# Patient Record
Sex: Female | Born: 1995 | Race: White | Hispanic: No | Smoking: Current every day smoker
Health system: Southern US, Community
[De-identification: ages and names within clinical notes are randomized; demographics above are authoritative.]

## PROBLEM LIST (undated history)

## (undated) DIAGNOSIS — O24419 Gestational diabetes mellitus in pregnancy, unspecified control: Secondary | ICD-10-CM

---

## 2018-01-03 ENCOUNTER — Other Ambulatory Visit: Payer: Self-pay

## 2018-01-03 ENCOUNTER — Emergency Department
Admission: EM | Admit: 2018-01-03 | Discharge: 2018-01-03 | Disposition: A | Discharge status: Home / Self Care | Payer: Self-pay | Attending: Emergency Medicine | Admitting: Emergency Medicine

## 2018-01-03 ENCOUNTER — Emergency Department: Payer: Self-pay

## 2018-01-03 DIAGNOSIS — R1084 Generalized abdominal pain: Secondary | ICD-10-CM | POA: Insufficient documentation

## 2018-01-03 DIAGNOSIS — N10 Acute tubulo-interstitial nephritis: Secondary | ICD-10-CM | POA: Insufficient documentation

## 2018-01-03 DIAGNOSIS — F17210 Nicotine dependence, cigarettes, uncomplicated: Secondary | ICD-10-CM | POA: Insufficient documentation

## 2018-01-03 DIAGNOSIS — N12 Tubulo-interstitial nephritis, not specified as acute or chronic: Secondary | ICD-10-CM

## 2018-01-03 LAB — COMPREHENSIVE METABOLIC PANEL
ALK PHOS: 87 U/L (ref 38–126)
ALT: 12 U/L (ref 0–44)
ANION GAP: 8 (ref 5–15)
AST: 16 U/L (ref 15–41)
Albumin: 4.2 g/dL (ref 3.5–5.0)
BUN: 9 mg/dL (ref 6–20)
CALCIUM: 9 mg/dL (ref 8.9–10.3)
CO2: 29 mmol/L (ref 22–32)
Chloride: 102 mmol/L (ref 98–111)
Creatinine, Ser: 0.48 mg/dL (ref 0.44–1.00)
Glucose, Bld: 89 mg/dL (ref 70–99)
Potassium: 3.7 mmol/L (ref 3.5–5.1)
SODIUM: 139 mmol/L (ref 135–145)
TOTAL PROTEIN: 7.4 g/dL (ref 6.5–8.1)
Total Bilirubin: 0.5 mg/dL (ref 0.3–1.2)

## 2018-01-03 LAB — URINALYSIS, ROUTINE W REFLEX MICROSCOPIC
BILIRUBIN URINE: NEGATIVE
Bacteria, UA: NONE SEEN
GLUCOSE, UA: NEGATIVE mg/dL
KETONES UR: NEGATIVE mg/dL
NITRITE: NEGATIVE
PH: 6 (ref 5.0–8.0)
Protein, ur: 30 mg/dL — AB
Specific Gravity, Urine: 1.003 — ABNORMAL LOW (ref 1.005–1.030)
WBC, UA: >50 WBC/hpf — ABNORMAL HIGH (ref 0–5)

## 2018-01-03 LAB — CBC WITH DIFFERENTIAL/PLATELET
Basophils Absolute: 0 10*3/uL (ref 0–0.1)
Basophils Relative: 0 %
EOS ABS: 0.1 10*3/uL (ref 0–0.7)
EOS PCT: 1 %
HCT: 33.5 % — ABNORMAL LOW (ref 35.0–47.0)
Hemoglobin: 11.5 g/dL — ABNORMAL LOW (ref 12.0–16.0)
LYMPHS ABS: 1.3 10*3/uL (ref 1.0–3.6)
LYMPHS PCT: 13 %
MCH: 27.4 pg (ref 26.0–34.0)
MCHC: 34.2 g/dL (ref 32.0–36.0)
MCV: 80.3 fL (ref 80.0–100.0)
MONOS PCT: 9 %
Monocytes Absolute: 0.8 10*3/uL (ref 0.2–0.9)
Neutro Abs: 7.4 10*3/uL — ABNORMAL HIGH (ref 1.4–6.5)
Neutrophils Relative %: 77 %
Platelets: 217 10*3/uL (ref 150–440)
RBC: 4.18 MIL/uL (ref 3.80–5.20)
RDW: 14.6 % — ABNORMAL HIGH (ref 11.5–14.5)
WBC: 9.7 10*3/uL (ref 3.6–11.0)

## 2018-01-03 LAB — POCT PREGNANCY, URINE: Preg Test, Ur: NEGATIVE

## 2018-01-03 MED ORDER — PHENAZOPYRIDINE HCL 200 MG PO TABS: 200.0000 mg | ORAL_TABLET | Freq: Three times a day (TID) | ORAL | 0 refills | Status: AC | PRN

## 2018-01-03 MED ORDER — SODIUM CHLORIDE 0.9 % IV SOLN
Freq: Once | INTRAVENOUS | Status: AC
  Administered 2018-01-03: 20:00:00 via INTRAVENOUS

## 2018-01-03 MED ORDER — SULFAMETHOXAZOLE-TRIMETHOPRIM 800-160 MG PO TABS: 1.0000 | ORAL_TABLET | Freq: Two times a day (BID) | ORAL | 0 refills | Status: DC

## 2018-01-03 MED ORDER — SODIUM CHLORIDE 0.9 % IV SOLN
1.0000 g | Freq: Once | INTRAVENOUS | Status: AC
  Administered 2018-01-03: 1 g via INTRAVENOUS

## 2018-01-03 MED ORDER — SODIUM CHLORIDE 0.9 % IV SOLN
INTRAVENOUS | Status: AC
  Administered 2018-01-03: 1 g via INTRAVENOUS
  Filled 2018-01-03: qty 10

## 2018-01-03 NOTE — ED Triage Notes (Signed)
Pt arrives to ED via POV from home with c/o low back pain x4 days. Pt denies any recent falls, injury, or trauma. Pt reports h/x of kidney infections in the past and states her current symptoms seem similar. No N/V/D, no fever, no CP or SHOB. Pt reports burning with urination, "like a UTI".

## 2018-01-03 NOTE — ED Provider Notes (Signed)
[facility] Emergency Department Provider Note       Time seen: ----------------------------------------- 8:01 PM on 01/03/2018 -----------------------------------------   I have reviewed the triage vital signs and the nursing notes.  HISTORY   Chief Complaint Back Pain    HPI Kimberly Tucker is a 22 y.o. female with no significant past medical history who presents to the ED for low back pain for the past 4 days.  Patient states she has had intermittent urinary tract infection symptoms for the past 2 weeks but it seemed to resolve spontaneously.  Subsequently she began having back pain and she has had one episode of pyelonephritis in the past.  She denies fevers or chills.  History reviewed. No pertinent past medical history.  There are no active problems to display for this patient.   History reviewed. No pertinent surgical history.  Allergies Patient has no known allergies.  Social History Social History   Tobacco Use  . Smoking status: Current Every Day Smoker    Types: E-cigarettes  . Smokeless tobacco: Never Used  Substance Use Topics  . Alcohol use: Not on file  . Drug use: Not on file    Review of Systems Constitutional: Negative for fever. Cardiovascular: Negative for chest pain. Respiratory: Negative for shortness of breath. Gastrointestinal: Negative for abdominal pain, vomiting and diarrhea. Genitourinary: Positive for dysuria Musculoskeletal: Positive for back pain Skin: Negative for rash. Neurological: Negative for headaches, focal weakness or numbness.  All systems negative/normal/unremarkable except as stated in the HPI  ____________________________________________   PHYSICAL EXAM:  VITAL SIGNS: ED Triage Vitals  Enc Vitals Group     BP 01/03/18 1919 131/75     Pulse Rate 01/03/18 1919 (!) 118     Resp 01/03/18 1919 18     Temp 01/03/18 1919 99.1 F (37.3 C)     Temp Source 01/03/18 1919 Oral     SpO2  01/03/18 1919 100 %     Weight 01/03/18 1919 153 lb (69.4 kg)     Height 01/03/18 1919 5\' 5"  (1.651 m)     Head Circumference --      Peak Flow --      Pain Score 01/03/18 1925 7     Pain Loc --      Pain Edu? --      Excl. in [place]? --    Constitutional: Alert and oriented. Well appearing and in no distress. Eyes: Conjunctivae are normal. Normal extraocular movements. ENT   Head: Normocephalic and atraumatic.   Nose: No congestion/rhinnorhea.   Mouth/Throat: Mucous membranes are moist.   Neck: No stridor. Cardiovascular: Normal rate, regular rhythm. No murmurs, rubs, or gallops. Respiratory: Normal respiratory effort without tachypnea nor retractions. Breath sounds are clear and equal bilaterally. No wheezes/rales/rhonchi. Gastrointestinal: Soft and nontender. Normal bowel sounds.  Bilateral CVA tenderness is noted Musculoskeletal: Nontender with normal range of motion in extremities. No lower extremity tenderness nor edema. Neurologic:  Normal speech and language. No gross focal neurologic deficits are appreciated.  Skin:  Skin is warm, dry and intact. No rash noted. Psychiatric: Mood and affect are normal. Speech and behavior are normal.  ___________________________________________  ED COURSE:  As part of my medical decision making, I reviewed the following data within the [place] History obtained from family if available, nursing notes, old chart and ekg, as well as notes from prior ED visits. Patient presented for back pain with recent urinary symptoms and likely pyelonephritis, we will assess with labs as  indicated at this time.   Procedures ____________________________________________   LABS (pertinent positives/negatives)  Labs Reviewed  URINALYSIS, ROUTINE W REFLEX MICROSCOPIC - Abnormal; Notable for the following components:      Result Value   Color, Urine STRAW (*)    APPearance HAZY (*)    Specific Gravity, Urine 1.003 (*)    Hgb  urine dipstick MODERATE (*)    Protein, ur 30 (*)    Leukocytes, UA LARGE (*)    WBC, UA >50 (*)    All other components within normal limits  CBC WITH DIFFERENTIAL/PLATELET - Abnormal; Notable for the following components:   Hemoglobin 11.5 (*)    HCT 33.5 (*)    RDW 14.6 (*)    Neutro Abs 7.4 (*)    All other components within normal limits  URINE CULTURE  COMPREHENSIVE METABOLIC PANEL  POC URINE PREG, ED  POCT PREGNANCY, URINE   CT Renal protocol IMPRESSION: 1. Negative for hydronephrosis or ureteral stone 2. Small calcified uterine fibroid ___________________________________________  DIFFERENTIAL DIAGNOSIS   UTI, pyelonephritis, renal colic, musculoskeletal pain    FINAL ASSESSMENT AND PLAN  Pyelonephritis   Plan: The patient had presented for low back pain with recent urinary symptoms. Patient's labs did reveal UTI and clinically she has pyelonephritis.  She was given fluids and IV Rocephin.  We did send a urine culture, she is cleared for outpatient follow-up.   Laurence Aly, MD   Note: This note was generated in part or whole with voice recognition software. Voice recognition is usually quite accurate but there are transcription errors that can and very often do occur. I apologize for any typographical errors that were not detected and corrected.     Earleen Newport, MD 01/03/18 2137

## 2018-01-06 LAB — URINE CULTURE
Culture: >=100000 — AB
Special Requests: NORMAL

## 2019-03-09 LAB — OB RESULTS CONSOLE GC/CHLAMYDIA
Chlamydia: NEGATIVE
Gonorrhea: NEGATIVE

## 2019-03-09 LAB — OB RESULTS CONSOLE RPR: RPR: NONREACTIVE

## 2019-03-09 LAB — OB RESULTS CONSOLE HIV ANTIBODY (ROUTINE TESTING): HIV: NONREACTIVE

## 2019-03-09 LAB — OB RESULTS CONSOLE ABO/RH: RH Type: POSITIVE

## 2019-03-09 LAB — OB RESULTS CONSOLE HEPATITIS B SURFACE ANTIGEN: Hepatitis B Surface Ag: NEGATIVE

## 2019-03-09 LAB — OB RESULTS CONSOLE RUBELLA ANTIBODY, IGM: Rubella: IMMUNE

## 2019-10-10 ENCOUNTER — Encounter (HOSPITAL_COMMUNITY): Payer: Self-pay | Admitting: *Deleted

## 2019-10-10 ENCOUNTER — Telehealth (HOSPITAL_COMMUNITY): Payer: Self-pay | Admitting: *Deleted

## 2019-10-10 NOTE — Telephone Encounter (Signed)
Preadmission screen  

## 2019-10-10 NOTE — Patient Instructions (Signed)
Kimberly Tucker  10/10/2019   Your procedure is scheduled on:  10/23/2019  Arrive at 1000 at Entrance C on [address] at [facility]  and [employer] [employer] [address]. You are invited to use the FREE valet parking or use the Visitor's parking deck.  Pick up the phone at the desk and dial [phone].  Call this number if you have problems the morning of surgery: [phone]  Remember:   Do not eat food:(After Midnight) Desps de medianoche.  Do not drink clear liquids: (After Midnight) Desps de medianoche.  Take these medicines the morning of surgery with A SIP OF WATER:  none   Do not wear jewelry, make-up or nail polish.  Do not wear lotions, powders, or perfumes. Do not wear deodorant.  Do not shave 48 hours prior to surgery.  Do not bring valuables to the hospital.  [facility] is not   responsible for any belongings or valuables brought to the hospital.  Contacts, dentures or bridgework may not be worn into surgery.  Leave suitcase in the car. After surgery it may be brought to your room.  For patients admitted to the hospital, checkout time is 11:00 AM the day of              discharge.      Please read over the following fact sheets that you were given:     Preparing for Surgery

## 2019-10-11 ENCOUNTER — Telehealth (HOSPITAL_COMMUNITY): Payer: Self-pay | Admitting: *Deleted

## 2019-10-11 NOTE — Telephone Encounter (Signed)
Preadmission screen  

## 2019-10-12 ENCOUNTER — Telehealth (HOSPITAL_COMMUNITY): Payer: Self-pay | Admitting: *Deleted

## 2019-10-12 NOTE — Telephone Encounter (Signed)
Preadmission screen  

## 2019-10-16 ENCOUNTER — Encounter (HOSPITAL_COMMUNITY): Payer: Self-pay

## 2019-10-21 ENCOUNTER — Other Ambulatory Visit: Payer: Self-pay

## 2019-10-21 ENCOUNTER — Other Ambulatory Visit (HOSPITAL_COMMUNITY)
Admission: RE | Admit: 2019-10-21 | Discharge: 2019-10-21 | Disposition: A | Discharge status: Home / Self Care | Payer: 59 | Source: Ambulatory Visit | Attending: Obstetrics | Admitting: Obstetrics

## 2019-10-21 DIAGNOSIS — Z01812 Encounter for preprocedural laboratory examination: Secondary | ICD-10-CM | POA: Insufficient documentation

## 2019-10-21 DIAGNOSIS — Z20822 Contact with and (suspected) exposure to covid-19: Secondary | ICD-10-CM | POA: Insufficient documentation

## 2019-10-21 HISTORY — DX: Gestational diabetes mellitus in pregnancy, unspecified control: O24.419

## 2019-10-21 LAB — CBC
HCT: 31 % — ABNORMAL LOW (ref 36.0–46.0)
Hemoglobin: 9.6 g/dL — ABNORMAL LOW (ref 12.0–15.0)
MCH: 24.2 pg — ABNORMAL LOW (ref 26.0–34.0)
MCHC: 31 g/dL (ref 30.0–36.0)
MCV: 78.3 fL — ABNORMAL LOW (ref 80.0–100.0)
Platelets: 159 10*3/uL (ref 150–400)
RBC: 3.96 MIL/uL (ref 3.87–5.11)
RDW: 16.2 % — ABNORMAL HIGH (ref 11.5–15.5)
WBC: 8.8 10*3/uL (ref 4.0–10.5)
nRBC: 0 % (ref 0.0–0.2)

## 2019-10-21 LAB — SARS CORONAVIRUS 2 (TAT 6-24 HRS): SARS Coronavirus 2: NEGATIVE

## 2019-10-21 LAB — ABO/RH: ABO/RH(D): A POS

## 2019-10-21 LAB — RPR: RPR Ser Ql: NONREACTIVE

## 2019-10-21 NOTE — MAU Note (Signed)
Asymptomatic, swab collected. Lab here for draw. 

## 2019-10-23 ENCOUNTER — Encounter (HOSPITAL_COMMUNITY)
Admission: RE | Disposition: A | Discharge status: Home / Self Care | Payer: Self-pay | Source: Ambulatory Visit | Attending: Obstetrics

## 2019-10-23 ENCOUNTER — Encounter (HOSPITAL_COMMUNITY): Payer: Self-pay | Admitting: Obstetrics

## 2019-10-23 ENCOUNTER — Inpatient Hospital Stay (HOSPITAL_COMMUNITY): Payer: 59 | Admitting: Anesthesiology

## 2019-10-23 ENCOUNTER — Other Ambulatory Visit: Payer: Self-pay

## 2019-10-23 ENCOUNTER — Inpatient Hospital Stay (HOSPITAL_COMMUNITY)
Admission: RE | Admit: 2019-10-23 | Discharge: 2019-10-25 | DRG: 788 | Disposition: A | Discharge status: Home / Self Care | Payer: 59 | Attending: Obstetrics | Admitting: Obstetrics

## 2019-10-23 DIAGNOSIS — D252 Subserosal leiomyoma of uterus: Secondary | ICD-10-CM | POA: Diagnosis present

## 2019-10-23 DIAGNOSIS — O99334 Smoking (tobacco) complicating childbirth: Secondary | ICD-10-CM | POA: Diagnosis present

## 2019-10-23 DIAGNOSIS — O3413 Maternal care for benign tumor of corpus uteri, third trimester: Secondary | ICD-10-CM | POA: Diagnosis present

## 2019-10-23 DIAGNOSIS — O24420 Gestational diabetes mellitus in childbirth, diet controlled: Secondary | ICD-10-CM | POA: Diagnosis present

## 2019-10-23 DIAGNOSIS — O34211 Maternal care for low transverse scar from previous cesarean delivery: Secondary | ICD-10-CM | POA: Diagnosis present

## 2019-10-23 DIAGNOSIS — Z20822 Contact with and (suspected) exposure to covid-19: Secondary | ICD-10-CM | POA: Diagnosis present

## 2019-10-23 DIAGNOSIS — F17290 Nicotine dependence, other tobacco product, uncomplicated: Secondary | ICD-10-CM | POA: Diagnosis present

## 2019-10-23 DIAGNOSIS — O34219 Maternal care for unspecified type scar from previous cesarean delivery: Secondary | ICD-10-CM | POA: Diagnosis present

## 2019-10-23 DIAGNOSIS — Z3A39 39 weeks gestation of pregnancy: Secondary | ICD-10-CM | POA: Diagnosis not present

## 2019-10-23 DIAGNOSIS — O9902 Anemia complicating childbirth: Secondary | ICD-10-CM | POA: Diagnosis present

## 2019-10-23 DIAGNOSIS — D509 Iron deficiency anemia, unspecified: Secondary | ICD-10-CM | POA: Diagnosis present

## 2019-10-23 LAB — TYPE AND SCREEN
ABO/RH(D): A POS
ABO/RH(D): A POS
Antibody Screen: NEGATIVE
Antibody Screen: NEGATIVE

## 2019-10-23 LAB — GLUCOSE, CAPILLARY
Glucose-Capillary: 68 mg/dL — ABNORMAL LOW (ref 70–99)
Glucose-Capillary: 90 mg/dL (ref 70–99)
Glucose-Capillary: 71 mg/dL (ref 70–99)

## 2019-10-23 SURGERY — Surgical Case
Anesthesia: Spinal

## 2019-10-23 MED ORDER — NALOXONE HCL 0.4 MG/ML IJ SOLN: 0.4000 mg | INTRAMUSCULAR | Status: DC | PRN

## 2019-10-23 MED ORDER — LACTATED RINGERS IV SOLN: INTRAVENOUS | Status: DC

## 2019-10-23 MED ORDER — ONDANSETRON HCL 4 MG/2ML IJ SOLN: 4.0000 mg | Freq: Once | INTRAMUSCULAR | Status: DC | PRN

## 2019-10-23 MED ORDER — SENNOSIDES-DOCUSATE SODIUM 8.6-50 MG PO TABS
2.0000 | ORAL_TABLET | ORAL | Status: DC
  Administered 2019-10-24 (×2): 2 via ORAL
  Filled 2019-10-23 (×2): qty 2

## 2019-10-23 MED ORDER — ONDANSETRON HCL 4 MG/2ML IJ SOLN
INTRAMUSCULAR | Status: AC
  Filled 2019-10-23: qty 2

## 2019-10-23 MED ORDER — SIMETHICONE 80 MG PO CHEW
80.0000 mg | CHEWABLE_TABLET | Freq: Three times a day (TID) | ORAL | Status: DC
  Administered 2019-10-23 – 2019-10-24 (×4): 80 mg via ORAL
  Filled 2019-10-23 (×5): qty 1

## 2019-10-23 MED ORDER — NALBUPHINE HCL 10 MG/ML IJ SOLN: 5.0000 mg | Freq: Once | INTRAMUSCULAR | Status: DC | PRN

## 2019-10-23 MED ORDER — SCOPOLAMINE 1 MG/3DAYS TD PT72
1.0000 | MEDICATED_PATCH | TRANSDERMAL | Status: DC
  Administered 2019-10-23: 1.5 mg via TRANSDERMAL

## 2019-10-23 MED ORDER — ONDANSETRON HCL 4 MG/2ML IJ SOLN: 4.0000 mg | Freq: Three times a day (TID) | INTRAMUSCULAR | Status: DC | PRN

## 2019-10-23 MED ORDER — OXYCODONE HCL 5 MG/5ML PO SOLN: 5.0000 mg | Freq: Once | ORAL | Status: DC | PRN

## 2019-10-23 MED ORDER — CEFAZOLIN SODIUM-DEXTROSE 2-4 GM/100ML-% IV SOLN
INTRAVENOUS | Status: AC
  Filled 2019-10-23: qty 100

## 2019-10-23 MED ORDER — DIPHENHYDRAMINE HCL 25 MG PO CAPS: 25.0000 mg | ORAL_CAPSULE | ORAL | Status: DC | PRN

## 2019-10-23 MED ORDER — SCOPOLAMINE 1 MG/3DAYS TD PT72: 1.0000 | MEDICATED_PATCH | Freq: Once | TRANSDERMAL | Status: DC

## 2019-10-23 MED ORDER — OXYTOCIN 40 UNITS IN NORMAL SALINE INFUSION - SIMPLE MED
INTRAVENOUS | Status: DC | PRN
  Administered 2019-10-23: 40 [IU] via INTRAVENOUS

## 2019-10-23 MED ORDER — FENTANYL CITRATE (PF) 100 MCG/2ML IJ SOLN: 25.0000 ug | INTRAMUSCULAR | Status: DC | PRN

## 2019-10-23 MED ORDER — MENTHOL 3 MG MT LOZG: 1.0000 | LOZENGE | OROMUCOSAL | Status: DC | PRN

## 2019-10-23 MED ORDER — OXYTOCIN 40 UNITS IN NORMAL SALINE INFUSION - SIMPLE MED: 2.5000 [IU]/h | INTRAVENOUS | Status: AC

## 2019-10-23 MED ORDER — OXYCODONE HCL 5 MG PO TABS: 5.0000 mg | ORAL_TABLET | Freq: Once | ORAL | Status: DC | PRN

## 2019-10-23 MED ORDER — SIMETHICONE 80 MG PO CHEW: 80.0000 mg | CHEWABLE_TABLET | ORAL | Status: DC | PRN

## 2019-10-23 MED ORDER — NALOXONE HCL 4 MG/10ML IJ SOLN
1.0000 ug/kg/h | INTRAVENOUS | Status: DC | PRN
  Filled 2019-10-23: qty 5

## 2019-10-23 MED ORDER — PHENYLEPHRINE HCL-NACL 20-0.9 MG/250ML-% IV SOLN
INTRAVENOUS | Status: AC
  Filled 2019-10-23: qty 250

## 2019-10-23 MED ORDER — FENTANYL CITRATE (PF) 100 MCG/2ML IJ SOLN
INTRAMUSCULAR | Status: AC
  Filled 2019-10-23: qty 2

## 2019-10-23 MED ORDER — LORATADINE 10 MG PO TABS
10.0000 mg | ORAL_TABLET | Freq: Every day | ORAL | Status: DC | PRN
  Administered 2019-10-23: 10 mg via ORAL
  Filled 2019-10-23: qty 1

## 2019-10-23 MED ORDER — DIPHENHYDRAMINE HCL 25 MG PO CAPS: 25.0000 mg | ORAL_CAPSULE | Freq: Four times a day (QID) | ORAL | Status: DC | PRN

## 2019-10-23 MED ORDER — KETOROLAC TROMETHAMINE 30 MG/ML IJ SOLN
30.0000 mg | Freq: Four times a day (QID) | INTRAMUSCULAR | Status: AC
  Administered 2019-10-23 – 2019-10-24 (×4): 30 mg via INTRAVENOUS
  Filled 2019-10-23 (×4): qty 1

## 2019-10-23 MED ORDER — MORPHINE SULFATE (PF) 0.5 MG/ML IJ SOLN
INTRAMUSCULAR | Status: DC | PRN
  Administered 2019-10-23: .15 mg via INTRATHECAL

## 2019-10-23 MED ORDER — OXYTOCIN 40 UNITS IN NORMAL SALINE INFUSION - SIMPLE MED
INTRAVENOUS | Status: AC
  Filled 2019-10-23: qty 1000

## 2019-10-23 MED ORDER — DIBUCAINE (PERIANAL) 1 % EX OINT: 1.0000 "application " | TOPICAL_OINTMENT | CUTANEOUS | Status: DC | PRN

## 2019-10-23 MED ORDER — LACTATED RINGERS IV SOLN: INTRAVENOUS | Status: DC | PRN

## 2019-10-23 MED ORDER — MEPERIDINE HCL 25 MG/ML IJ SOLN: 6.2500 mg | INTRAMUSCULAR | Status: DC | PRN

## 2019-10-23 MED ORDER — COCONUT OIL OIL: 1.0000 "application " | TOPICAL_OIL | Status: DC | PRN

## 2019-10-23 MED ORDER — NALBUPHINE HCL 10 MG/ML IJ SOLN: 5.0000 mg | INTRAMUSCULAR | Status: DC | PRN

## 2019-10-23 MED ORDER — PRENATAL MULTIVITAMIN CH
1.0000 | ORAL_TABLET | Freq: Every day | ORAL | Status: DC
  Administered 2019-10-24 – 2019-10-25 (×2): 1 via ORAL
  Filled 2019-10-23 (×2): qty 1

## 2019-10-23 MED ORDER — KETOROLAC TROMETHAMINE 30 MG/ML IJ SOLN
30.0000 mg | Freq: Once | INTRAMUSCULAR | Status: AC | PRN
  Administered 2019-10-23: 30 mg via INTRAVENOUS

## 2019-10-23 MED ORDER — BUPIVACAINE IN DEXTROSE 0.75-8.25 % IT SOLN
INTRATHECAL | Status: DC | PRN
  Administered 2019-10-23: 1.6 mL via INTRATHECAL

## 2019-10-23 MED ORDER — SCOPOLAMINE 1 MG/3DAYS TD PT72
MEDICATED_PATCH | TRANSDERMAL | Status: AC
  Filled 2019-10-23: qty 1

## 2019-10-23 MED ORDER — OXYCODONE HCL 5 MG PO TABS: 5.0000 mg | ORAL_TABLET | ORAL | Status: DC | PRN

## 2019-10-23 MED ORDER — PHENYLEPHRINE HCL-NACL 20-0.9 MG/250ML-% IV SOLN
INTRAVENOUS | Status: DC | PRN
  Administered 2019-10-23: 60 ug/min via INTRAVENOUS

## 2019-10-23 MED ORDER — FENTANYL CITRATE (PF) 100 MCG/2ML IJ SOLN
INTRAMUSCULAR | Status: DC | PRN
  Administered 2019-10-23: 15 ug via INTRATHECAL

## 2019-10-23 MED ORDER — ONDANSETRON HCL 4 MG/2ML IJ SOLN
INTRAMUSCULAR | Status: DC | PRN
  Administered 2019-10-23: 4 mg via INTRAVENOUS

## 2019-10-23 MED ORDER — ACETAMINOPHEN 500 MG PO TABS
1000.0000 mg | ORAL_TABLET | Freq: Four times a day (QID) | ORAL | Status: DC
  Administered 2019-10-23 – 2019-10-25 (×8): 1000 mg via ORAL
  Filled 2019-10-23 (×9): qty 2

## 2019-10-23 MED ORDER — IBUPROFEN 800 MG PO TABS
800.0000 mg | ORAL_TABLET | Freq: Four times a day (QID) | ORAL | Status: DC
  Administered 2019-10-24 – 2019-10-25 (×4): 800 mg via ORAL
  Filled 2019-10-23 (×4): qty 1

## 2019-10-23 MED ORDER — SIMETHICONE 80 MG PO CHEW
80.0000 mg | CHEWABLE_TABLET | ORAL | Status: DC
  Administered 2019-10-24 (×2): 80 mg via ORAL
  Filled 2019-10-23 (×2): qty 1

## 2019-10-23 MED ORDER — SODIUM CHLORIDE 0.9 % IR SOLN
Status: DC | PRN
  Administered 2019-10-23: 1

## 2019-10-23 MED ORDER — DIPHENHYDRAMINE HCL 50 MG/ML IJ SOLN: 12.5000 mg | INTRAMUSCULAR | Status: DC | PRN

## 2019-10-23 MED ORDER — KETOROLAC TROMETHAMINE 30 MG/ML IJ SOLN
INTRAMUSCULAR | Status: AC
  Filled 2019-10-23: qty 1

## 2019-10-23 MED ORDER — MORPHINE SULFATE (PF) 0.5 MG/ML IJ SOLN
INTRAMUSCULAR | Status: AC
  Filled 2019-10-23: qty 10

## 2019-10-23 MED ORDER — CEFAZOLIN SODIUM-DEXTROSE 2-3 GM-%(50ML) IV SOLR
INTRAVENOUS | Status: DC | PRN
  Administered 2019-10-23: 2 g via INTRAVENOUS

## 2019-10-23 MED ORDER — WITCH HAZEL-GLYCERIN EX PADS: 1.0000 "application " | MEDICATED_PAD | CUTANEOUS | Status: DC | PRN

## 2019-10-23 MED ORDER — SODIUM CHLORIDE 0.9 % IV SOLN: INTRAVENOUS | Status: DC | PRN

## 2019-10-23 MED ORDER — CEFAZOLIN SODIUM-DEXTROSE 2-4 GM/100ML-% IV SOLN: 2.0000 g | Freq: Once | INTRAVENOUS | Status: DC

## 2019-10-23 MED ORDER — SODIUM CHLORIDE 0.9% FLUSH
3.0000 mL | INTRAVENOUS | Status: DC | PRN
  Administered 2019-10-24: 3 mL via INTRAVENOUS

## 2019-10-23 MED ORDER — DEXTROSE 50 % IV SOLN
12.5000 g | INTRAVENOUS | Status: AC
  Administered 2019-10-23: 12.5 g via INTRAVENOUS

## 2019-10-23 MED ORDER — DEXTROSE 50 % IV SOLN
INTRAVENOUS | Status: AC
  Filled 2019-10-23: qty 50

## 2019-10-23 SURGICAL SUPPLY — 36 items
BENZOIN TINCTURE PRP APPL 2/3 (GAUZE/BANDAGES/DRESSINGS) ×3 IMPLANT
CHLORAPREP W/TINT 26ML (MISCELLANEOUS) ×3 IMPLANT
CLAMP CORD UMBIL (MISCELLANEOUS) IMPLANT
CLOSURE WOUND 1/2 X4 (GAUZE/BANDAGES/DRESSINGS) ×1
CLOTH BEACON ORANGE TIMEOUT ST (SAFETY) ×3 IMPLANT
DRSG OPSITE POSTOP 4X10 (GAUZE/BANDAGES/DRESSINGS) ×3 IMPLANT
ELECT REM PT RETURN 9FT ADLT (ELECTROSURGICAL) ×3
ELECTRODE REM PT RTRN 9FT ADLT (ELECTROSURGICAL) ×1 IMPLANT
EXTRACTOR VACUUM KIWI (MISCELLANEOUS) IMPLANT
GLOVE BIOGEL PI IND STRL 6.5 (GLOVE) ×1 IMPLANT
GLOVE BIOGEL PI IND STRL 7.0 (GLOVE) ×1 IMPLANT
GLOVE BIOGEL PI INDICATOR 6.5 (GLOVE) ×2
GLOVE BIOGEL PI INDICATOR 7.0 (GLOVE) ×2
GLOVE ECLIPSE 6.0 STRL STRAW (GLOVE) ×3 IMPLANT
GOWN STRL REUS W/TWL LRG LVL3 (GOWN DISPOSABLE) ×6 IMPLANT
KIT ABG SYR 3ML LUER SLIP (SYRINGE) IMPLANT
NEEDLE HYPO 25X5/8 SAFETYGLIDE (NEEDLE) IMPLANT
NS IRRIG 1000ML POUR BTL (IV SOLUTION) ×3 IMPLANT
PACK C SECTION WH (CUSTOM PROCEDURE TRAY) ×3 IMPLANT
PAD OB MATERNITY 4.3X12.25 (PERSONAL CARE ITEMS) ×3 IMPLANT
PENCIL SMOKE EVAC W/HOLSTER (ELECTROSURGICAL) ×3 IMPLANT
RTRCTR C-SECT PINK 25CM LRG (MISCELLANEOUS) ×3 IMPLANT
STRIP CLOSURE SKIN 1/2X4 (GAUZE/BANDAGES/DRESSINGS) ×2 IMPLANT
SUT MNCRL 0 VIOLET CTX 36 (SUTURE) ×2 IMPLANT
SUT MONOCRYL 0 CTX 36 (SUTURE) ×4
SUT PLAIN 0 NONE (SUTURE) IMPLANT
SUT PLAIN 2 0 (SUTURE) ×2
SUT PLAIN ABS 2-0 CT1 27XMFL (SUTURE) ×1 IMPLANT
SUT VIC AB 0 CTX 36 (SUTURE) ×4
SUT VIC AB 0 CTX36XBRD ANBCTRL (SUTURE) ×2 IMPLANT
SUT VIC AB 2-0 CT1 27 (SUTURE) ×2
SUT VIC AB 2-0 CT1 TAPERPNT 27 (SUTURE) ×1 IMPLANT
SUT VICRYL 4-0 PS2 18IN ABS (SUTURE) ×3 IMPLANT
TOWEL OR 17X24 6PK STRL BLUE (TOWEL DISPOSABLE) ×3 IMPLANT
TRAY FOLEY W/BAG SLVR 14FR LF (SET/KITS/TRAYS/PACK) ×3 IMPLANT
WATER STERILE IRR 1000ML POUR (IV SOLUTION) ×3 IMPLANT

## 2019-10-23 NOTE — Anesthesia Preprocedure Evaluation (Signed)
Anesthesia Evaluation  Patient identified by MRN, date of birth, ID band Patient awake    Reviewed: Allergy & Precautions, H&P , NPO status , Patient's Chart, lab work & pertinent test results  History of Anesthesia Complications Negative for: history of anesthetic complications  Airway Mallampati: II  TM Distance: >3 FB Neck ROM: full    Dental no notable dental hx.    Pulmonary neg pulmonary ROS, Current Smoker,    Pulmonary exam normal        Cardiovascular negative cardio ROS Normal cardiovascular exam     Neuro/Psych negative neurological ROS  negative psych ROS   GI/Hepatic negative GI ROS, Neg liver ROS,   Endo/Other  diabetes, Gestational  Renal/GU negative Renal ROS  negative genitourinary   Musculoskeletal   Abdominal   Peds  Hematology  (+) Blood dyscrasia, anemia ,   Anesthesia Other Findings   Reproductive/Obstetrics (+) Pregnancy                            Anesthesia Physical Anesthesia Plan  ASA: II  Anesthesia Plan: Spinal   Post-op Pain Management:    Induction:   PONV Risk Score and Plan: Ondansetron and Treatment may vary due to age or medical condition  Airway Management Planned:   Additional Equipment:   Intra-op Plan:   Post-operative Plan:   Informed Consent: I have reviewed the patients History and Physical, chart, labs and discussed the procedure including the risks, benefits and alternatives for the proposed anesthesia with the patient or authorized representative who has indicated his/her understanding and acceptance.       Plan Discussed with:   Anesthesia Plan Comments:         Anesthesia Quick Evaluation

## 2019-10-23 NOTE — Op Note (Signed)
Cesarean Section Procedure Note  Pre-operative Diagnosis: 1. Intrauterine pregnancy at [redacted]w[redacted]d  2. History of cesarean section, desires repeat  3.  Gestational diabetes mellitus, class A1  Post-operative Diagnosis: same as above  Surgeon: Jerelyn Charles, MD  Assistants: Vanessa Kick, MD  Procedure: Repeat low transverse cesarean section   Anesthesia: Spinal anesthesia  Estimated Blood Loss: 370 mL         Drains: Foley catheter         Specimens: placenta to L&D              Complications:  None; patient tolerated the procedure well.         Disposition: PACU - hemodynamically stable.  Findings:  Normal uterus, tubes and ovaries bilaterally.  Small 2 cm subserosal fibroid anterior mid uterus.  Viable female infant, 3495 g (7 lb 11.3 oz) Apgars 9, 9.    Procedure Details   After spinal  anesthesia was found to adequate, the patient was placed in the dorsal supine position with a leftward tilt, prepped and draped in the usual sterile manner. A Pfannenstiel incision was made through the prior scar and carried down through the subcutaneous tissue to the fascia.  The fascia was incised in the midline and the fascial incision was extended laterally with Mayo scissors. The superior aspect of the fascial incision was grasped with two Kocher clamps, tented up and the rectus muscles dissected off sharply. The rectus was then dissected off with blunt dissection and Mayo scissors inferiorly. The rectus muscles were separated in the midline. The abdominal peritoneum was identified, tented up, entered sharply, and the incision was extended superiorly and inferiorly with good visualization of the bladder. The Alexis retractor was deployed. The vesicouterine peritoneum was identified, tented up, entered sharply, and the bladder flap was created digitally. A scalpel was then used to make a low transverse incision on the uterus which was extended in the cephalad-caudad direction with blunt dissection. The fluid  was clear. The fetal vertex was identified, elevated out of the pelvis and brought to the hysterotomy.  The head was delivered easily followed by the shoulders and body.  After a 60 second delay per protocol, the cord was clamped and cut and the infant was passed to the waiting neonatologist.  The placenta was then delivered spontaneously, intact and appear normal, the uterus was cleared of all clot and debris   The hysterotomy was repaired with #0 Monocryl in running locked fashion.  Excellent hemostasis was noted.  The Alexis retractor was removed from the abdomen. The peritoneum was examined and all vessels noted to be hemostatic. The abdominal cavity was cleared of all clot and debris.  The peritoneum was closed with 2-0 vicryl in a running fashion.  The rectus muscles were then closed with 2-0 Vicryl. The fascia and rectus muscles were inspected and were hemostatic. The fascia was closed with 0 Vicryl in a running fashion. The subcutaneous layer was irrigated and all bleeders cauterized. The subcutaneous layer was closed with interrupted plain gut. The skin was closed with 3-0 monocryl in a subcuticular fashion. The incision was dressed with benzoine, steri strips and honeycomb dressing. All sponge lap and needle counts were correct x3. Patient tolerated the procedure well and recovered in stable condition following the procedure.

## 2019-10-23 NOTE — H&P (Signed)
24 y.o. G2P1001 @ [redacted]w[redacted]d presents for repeat cesarean section.    Otherwise has good fetal movement and no bleeding.     Pregnancy c/b: 1. Hstory of cesarean section for breech presentation.  Desires a repeat.   2. GDMA1 3.  Iron deficiency anemia.  Hgb 9.6 on admission  Past Medical History:  Diagnosis Date  . Gestational diabetes     Past Surgical History:  Procedure Laterality Date  . CESAREAN SECTION      OB History  Gravida Para Term Preterm AB Living  2 1 1     1   SAB TAB Ectopic Multiple Live Births          1    # Outcome Date GA Lbr Len/2nd Weight Sex Delivery Anes PTL Lv  2 Current           1 Term 07/25/16 [redacted]w[redacted]d  3657 g M CS-LTranv   LIV    Social History   Socioeconomic History  . Marital status: Married    Spouse name: Not on file  . Number of children: Not on file  . Years of education: Not on file  . Highest education level: Not on file  Occupational History  . Not on file  Tobacco Use  . Smoking status: Current Every Day Smoker    Types: E-cigarettes  . Smokeless tobacco: Never Used  Substance and Sexual Activity  . Alcohol use: Never  . Drug use: Never  . Sexual activity: Not on file  Other Topics Concern  . Not on file  Social History Narrative  . Not on file       Patient has no known allergies.    Prenatal Transfer Tool  Maternal Diabetes: Yes:  Diabetes Type:  Diet controlled Genetic Screening: Normal Maternal Ultrasounds/Referrals: Normal Fetal Ultrasounds or other Referrals:  None Maternal Substance Abuse:  No Significant Maternal Medications:  None Significant Maternal Lab Results: Group B Strep negative  ABO, Rh: --/--/PENDING (05/17 1040) Antibody: PENDING (05/17 1040) Rubella: Immune (10/01 0000) RPR: NON REACTIVE (05/15 0845)  HBsAg: Negative (10/01 0000)  HIV: Non-reactive (10/01 0000)  GBS:   Negative      Vitals:   10/23/19 1036  BP: (!) 125/96  Pulse: 88  Resp: 18  Temp: 98.3 F (36.8 C)  SpO2: 100%      General:  NAD Abdomen:  soft, gravid FHTs:  148    A/P   24 y.o. G2P1001 [redacted]w[redacted]d presents for repeat cesarean section.  Discussed risks of cesarean section to include, but not limited to, infection, bleeding, damage to surrounding strutcures (including bowel, bladder, tubes, ovaries, nerves, vessels, baby), need for additional procedures, risk of blood clot, need for transfusion. Consent signed Ancef 2gm on call to [place]

## 2019-10-23 NOTE — Anesthesia Procedure Notes (Signed)
Spinal  Patient location during procedure: OR Staffing Performed: anesthesiologist  Anesthesiologist: Lauralie Blacksher E, MD Preanesthetic Checklist Completed: patient identified, IV checked, risks and benefits discussed, surgical consent, monitors and equipment checked, pre-op evaluation and timeout performed Spinal Block Patient position: sitting Prep: DuraPrep and site prepped and draped Patient monitoring: continuous pulse ox, blood pressure and heart rate Approach: midline Location: L3-4 Injection technique: single-shot Needle Needle type: Pencan  Needle gauge: 24 G Needle length: 9 cm Additional Notes Functioning IV was confirmed and monitors were applied. Sterile prep and drape, including hand hygiene and sterile gloves were used. The patient was positioned and the spine was prepped. The skin was anesthetized with lidocaine.  Free flow of clear CSF was obtained prior to injecting local anesthetic into the CSF. The needle was carefully withdrawn. The patient tolerated the procedure well.      

## 2019-10-23 NOTE — Transfer of Care (Signed)
Immediate Anesthesia Transfer of Care Note  Patient: Kimberly Tucker  Procedure(s) Performed: CESAREAN SECTION (N/A )  Patient Location: PACU  Anesthesia Type:Spinal  Level of Consciousness: awake  Airway & Oxygen Therapy: Patient Spontanous Breathing  Post-op Assessment: Report given to RN and Post -op Vital signs reviewed and stable  Post vital signs: Reviewed and stable  Last Vitals:  Vitals Value Taken Time  BP    Temp    Pulse    Resp    SpO2      Last Pain: There were no vitals filed for this visit.       Complications: No apparent anesthesia complications

## 2019-10-24 ENCOUNTER — Encounter: Payer: Self-pay | Admitting: *Deleted

## 2019-10-24 LAB — CBC
HCT: 25.3 % — ABNORMAL LOW (ref 36.0–46.0)
Hemoglobin: 7.6 g/dL — ABNORMAL LOW (ref 12.0–15.0)
MCH: 24 pg — ABNORMAL LOW (ref 26.0–34.0)
MCHC: 30 g/dL (ref 30.0–36.0)
MCV: 79.8 fL — ABNORMAL LOW (ref 80.0–100.0)
Platelets: 150 10*3/uL (ref 150–400)
RBC: 3.17 MIL/uL — ABNORMAL LOW (ref 3.87–5.11)
RDW: 16.4 % — ABNORMAL HIGH (ref 11.5–15.5)
WBC: 8.1 10*3/uL (ref 4.0–10.5)
nRBC: 0 % (ref 0.0–0.2)

## 2019-10-24 LAB — BIRTH TISSUE RECOVERY COLLECTION (PLACENTA DONATION)

## 2019-10-24 LAB — GLUCOSE, CAPILLARY: Glucose-Capillary: 72 mg/dL (ref 70–99)

## 2019-10-24 MED ORDER — FERROUS SULFATE 325 (65 FE) MG PO TABS
325.0000 mg | ORAL_TABLET | Freq: Two times a day (BID) | ORAL | Status: DC
  Administered 2019-10-24 – 2019-10-25 (×3): 325 mg via ORAL
  Filled 2019-10-24 (×3): qty 1

## 2019-10-24 NOTE — Progress Notes (Signed)
Parents desires circumsision.  All risks, benefits and alternatives discussed with the mother.

## 2019-10-24 NOTE — Progress Notes (Signed)
  Patient is eating, ambulating, voiding.  Pain control is good.  Vitals:   10/23/19 1730 10/23/19 1955 10/24/19 0043 10/24/19 0555  BP: 126/78  98/61 (!) 108/56  Pulse: 86  84 (!) 58  Resp: 20 18 18 18   Temp: 98 F (36.7 C)  97.8 F (36.6 C) 98 F (36.7 C)  TempSrc:    Oral  SpO2: 100% 99% 100%   Weight:      Height:        lungs:   clear to auscultation cor:    RRR Abdomen:  soft, appropriate tenderness, incisions intact and without erythema or exudate ex:    no cords   Lab Results  Component Value Date   WBC 8.1 10/24/2019   HGB 7.6 (L) 10/24/2019   HCT 25.3 (L) 10/24/2019   MCV 79.8 (L) 10/24/2019   PLT 150 10/24/2019    --/--/A POS (05/17 1040)/RI  A/P    Post operative day 1.  Routine post op and postpartum care.  Expect d/c routine.  Percocet for pain control. Iron and anemia precautions.

## 2019-10-24 NOTE — Anesthesia Postprocedure Evaluation (Signed)
Anesthesia Post Note  Patient: Immunologist  Procedure(s) Performed: CESAREAN SECTION (N/A )     Patient location during evaluation: PACU Anesthesia Type: Spinal Level of consciousness: oriented and awake and alert Pain management: pain level controlled Vital Signs Assessment: post-procedure vital signs reviewed and stable Respiratory status: spontaneous breathing, respiratory function stable and nonlabored ventilation Cardiovascular status: blood pressure returned to baseline and stable Postop Assessment: no headache, no backache, no apparent nausea or vomiting and spinal receding Anesthetic complications: no    Last Vitals:  Vitals:   10/24/19 0043 10/24/19 0555  BP: 98/61 (!) 108/56  Pulse: 84 (!) 58  Resp: 18 18  Temp: 36.6 C 36.7 C  SpO2: 100%     Last Pain:  Vitals:   10/24/19 0710  TempSrc:   PainSc: [place]

## 2019-10-24 NOTE — Lactation Note (Signed)
This note was copied from a baby's chart. Lactation Consultation Note  Patient Name: Kimberly Tucker [id number] Date: 10/24/2019 Reason for consult: Initial assessment;Infant weight loss;Term Baby is 22 hours old/7% weight loss. Mom is a P3 and breastfed her previous babies without difficulty.  She reports that newborn is latching easily but does not sustain latch.  Baby is currently sleeping following circumcision.  Discussed initiating pumping due to weight loss and difficult feeds.  Mom is agreeable.  She has a breast pump at home.  Symphony pump set up and initiated with 24 mm flanges.  Mom has erect nipples.  Colostrum noted from both breasts.  Instructed to finger or spoon feed expressed milk.  Instructed to put baby to breast with cues and call for [facility] assist.  Mom will also post pump and give expressed milk to baby.  Breastfeeding consultation services information given and reviewed.  Maternal Data Does the patient have breastfeeding experience prior to this delivery?: Yes  Feeding    LATCH Score                   Interventions Interventions: DEBP  Lactation Tools Discussed/Used Pump Review: Setup, frequency, and cleaning;Milk Storage Initiated by:: [email or web address] Date initiated:: 10/24/19   Consult Status Consult Status: Follow-up Date: 10/25/19 Follow-up type: In-patient    Ave Filter 10/24/2019, 10:40 AM

## 2019-10-25 LAB — GLUCOSE, CAPILLARY: Glucose-Capillary: 71 mg/dL (ref 70–99)

## 2019-10-25 MED ORDER — OXYCODONE HCL 5 MG PO TABS: 5.0000 mg | ORAL_TABLET | ORAL | 0 refills | Status: AC | PRN

## 2019-10-25 NOTE — Discharge Summary (Signed)
Postpartum Discharge Summary     Patient Name: Kimberly Tucker DOB: March 08, 1996 MRN: [id number]  Date of admission: 10/23/2019 Delivery date:10/23/2019  Delivering provider: Jerelyn Charles  Date of discharge: 10/25/2019  Admitting diagnosis: History of cesarean delivery, antepartum [O34.219] Intrauterine pregnancy: [redacted]w[redacted]d    Secondary diagnosis:  Active Problems:   History of cesarean delivery, antepartum  Additional problems: GDMA1, iron deficiency anemia with post op exacerbation    Discharge diagnosis: Term Pregnancy Delivered, GDM A1 and Anemia                                              Post partum procedures:none Augmentation: N/A Complications: None  Hospital course: Sceduled C/S   25y.o. yo G2P2002 at 365w2das admitted to the hospital 10/23/2019 with SROM, planned scheduled cesarean section with the following indication:Malpresentation.Delivery details are as follows:  Membrane Rupture Time/Date: 12:25 PM ,10/23/2019   Delivery Method:C-Section, Low Transverse  Details of operation can be found in separate operative note.  Patient had an uncomplicated postpartum course.  She is ambulating, tolerating a regular diet, passing flatus, and urinating well. Patient is discharged home in stable condition on  10/25/19        Newborn Data: Birth date:10/23/2019  Birth time:12:25 PM  Gender:Female  Living status:Living  Apgars:9 ,9  Weight:3495 g     Magnesium Sulfate received: No BMZ received: No Rhophylac:N/A MMR:N/A T-DaP: see office record Flu: see office record Transfusion:No  Physical exam  Vitals:   10/24/19 0555 10/24/19 1300 10/24/19 2156 10/25/19 0531  BP: (!) 108/56 111/88 109/61 97/62  Pulse: (!) 58 66 71 72  Resp: 18  16 18   Temp: 98 F (36.7 C) 97.8 F (36.6 C) 98.5 F (36.9 C) 98.2 F (36.8 C)  TempSrc: Oral Oral Oral Oral  SpO2:    100%  Weight:      Height:       General: alert, cooperative and no distress Lochia: appropriate Uterine  Fundus: firm Incision: Healing well with no significant drainage DVT Evaluation: No evidence of DVT seen on physical exam. Labs: Lab Results  Component Value Date   WBC 8.1 10/24/2019   HGB 7.6 (L) 10/24/2019   HCT 25.3 (L) 10/24/2019   MCV 79.8 (L) 10/24/2019   PLT 150 10/24/2019   CMP Latest Ref Rng & Units 01/03/2018  Glucose 70 - 99 mg/dL 89  BUN 6 - 20 mg/dL 9  Creatinine 0.44 - 1.00 mg/dL 0.48  Sodium 135 - 145 mmol/L 139  Potassium 3.5 - 5.1 mmol/L 3.7  Chloride 98 - 111 mmol/L 102  CO2 22 - 32 mmol/L 29  Calcium 8.9 - 10.3 mg/dL 9.0  Total Protein 6.5 - 8.1 g/dL 7.4  Total Bilirubin 0.3 - 1.2 mg/dL 0.5  Alkaline Phos 38 - 126 U/L 87  AST 15 - 41 U/L 16  ALT 0 - 44 U/L 12   Edinburgh Score: Edinburgh Postnatal Depression Scale Screening Tool 10/24/2019  I have been able to laugh and see the funny side of things. 0  I have looked forward with enjoyment to things. 0  I have blamed myself unnecessarily when things went wrong. 0  I have been anxious or worried for no good reason. 0  I have felt scared or panicky for no good reason. 0  Things have been getting on top of me. 0  I have been so unhappy that I have had difficulty sleeping. 0  I have felt sad or miserable. 0  I have been so unhappy that I have been crying. 0  The thought of harming myself has occurred to me. 0  Edinburgh Postnatal Depression Scale Total 0      After visit meds:  Allergies as of 10/25/2019   No Known Allergies     Medication List    TAKE these medications   cetirizine 10 MG tablet Commonly known as: ZYRTEC Take 10 mg by mouth daily as needed for allergies.   ferrous sulfate 325 (65 FE) MG tablet Take 325 mg by mouth 2 (two) times daily.   oxyCODONE 5 MG immediate release tablet Commonly known as: Oxy IR/ROXICODONE Take 1-2 tablets (5-10 mg total) by mouth every 4 (four) hours as needed for moderate pain.   PRENATAL PO Take 1 tablet by mouth daily.            Discharge  Care Instructions  (From admission, onward)         Start     Ordered   10/25/19 0000  Discharge wound care:    Comments: Remove dressing and steri strips 7 days from date of surgery   10/25/19 0919           Discharge home in stable condition Infant Feeding: Bottle and Breast Infant Disposition:home with mother Discharge instruction: per After Visit Summary and Postpartum booklet. Activity: Advance as tolerated. Pelvic rest for 6 weeks.  Diet: routine diet Anticipated Birth Control: Unsure Postpartum Appointment:4 weeks Additional Postpartum F/U: Postpartum Depression checkup Future Appointments:No future appointments. Follow up Visit: Follow-up Information    Jerelyn Charles, MD Follow up in 4 week(s).   Specialty: Obstetrics Contact information: [address] [place] [address] [phone]               10/25/2019 Allyn Kenner, DO

## 2019-10-25 NOTE — Lactation Note (Signed)
This note was copied from a baby's chart. Lactation Consultation Note  Patient Name: Kimberly Tucker [id number] Date: 10/25/2019 Reason for consult: Follow-up assessment   Mother is a P2, infant is 65 hours old and is now at 9 % wt loss.  LC arrived in the room and mother was breastfeed infant in a laid back position with infant not support . Infant across mothers tummy and feet on the bed. Infants lower lip tucked in .  LC suggested that mother tug on lower jaw and widen the infants gape for wider latch.  Mother appeared very comfortable.  LC did have a concern that infant was slipping at times. Mother denies having any pain or discomfort on her nipple tissue.   Mother has began to supplement infant as per MD order with formula.  Mother has pump at the bedside.   Plan of Care : Breastfeed infant with feeding cues Supplement infant with ebm/formula, according to supplemental guidelines. Pump using a DEBP after each feeding for 15-20 mins.   Discussed treatment and prevention of engorgement   Mother to continue to cue base feed infant and feed at least 8-12 times or more in 24 hours and advised to allow for cluster feeding infant as needed.   Mother to continue to due STS. Mother is aware of available LC services at [facility], [facility], OP Dept, and phone # for questions or concerns about breastfeeding.  Mother receptive to all teaching and plan of care.     Maternal Data    Feeding Feeding Type: Breast Fed  LATCH Score Latch: Grasps breast easily, tongue down, lips flanged, rhythmical sucking.  Audible Swallowing: Spontaneous and intermittent  Type of Nipple: Everted at rest and after stimulation  Comfort (Breast/Nipple): Soft / non-tender  Hold (Positioning): No assistance needed to correctly position infant at breast.  LATCH Score: 10  Interventions    Lactation Tools Discussed/Used     Consult Status Consult Status: Complete    Darla Lesches 10/25/2019, 11:51 AM

## 2019-12-16 IMAGING — CT CT RENAL STONE PROTOCOL
3 of 4 series · 8 of 46 positions shown, 15 images · non-contrast
Comparison: None.

CLINICAL DATA: Bilateral flank pain

EXAM:
CT ABDOMEN AND PELVIS WITHOUT CONTRAST
TECHNIQUE: Multidetector CT imaging of the abdomen and pelvis was performed
following the standard protocol without IV contrast.

[Series 4: lung bases · axial · 0.64mm/px · z∈[-603,-543]mm · 4 of 20 slices shown, 9 images]
[im 4/20  soft-tissue]
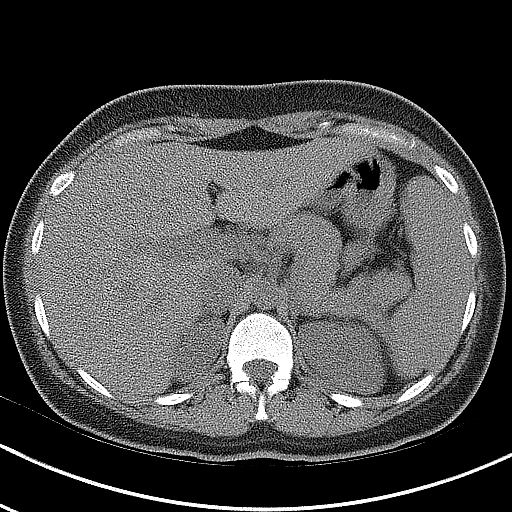
[im 4/20  lung]
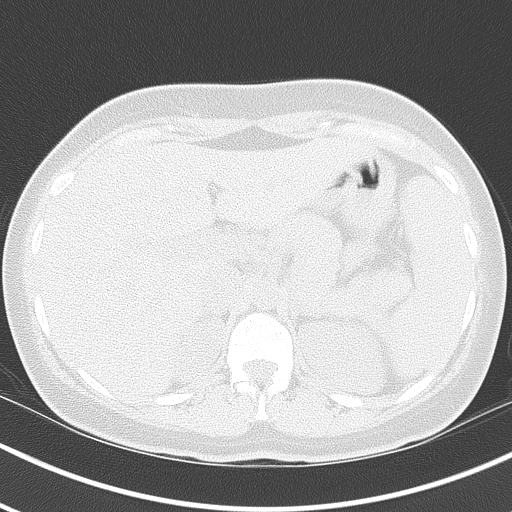
[im 4/20  bone]
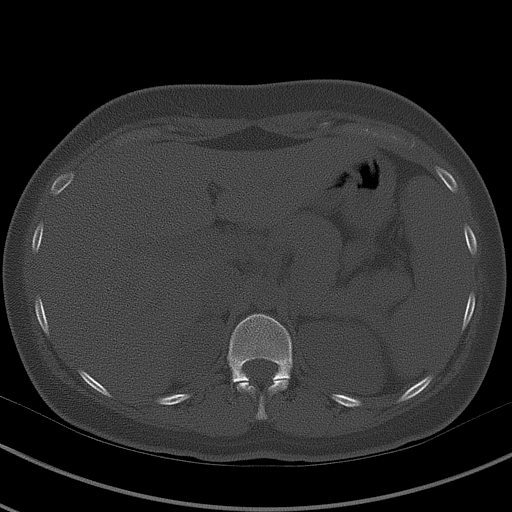
[im 8/20  soft-tissue]
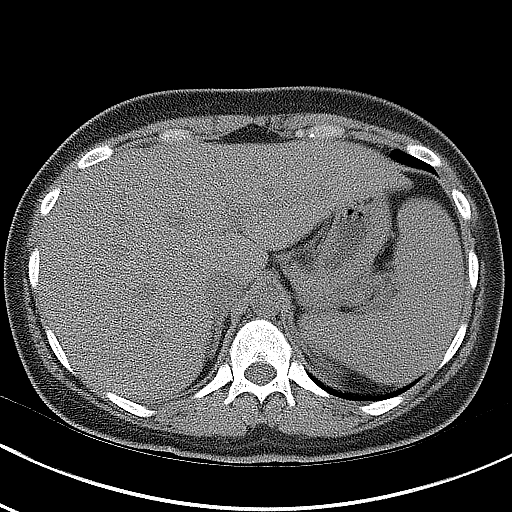
[im 8/20  lung]
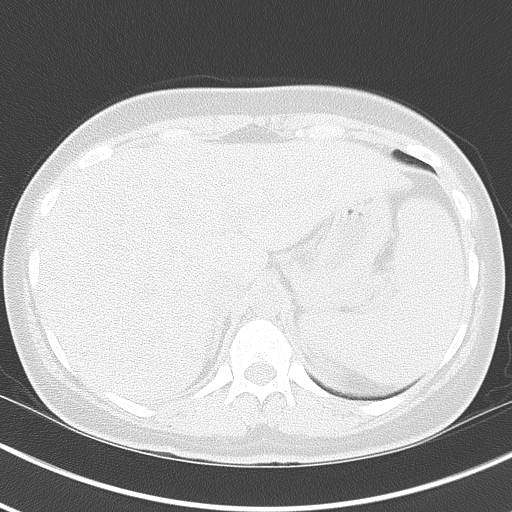
[im 12/20  soft-tissue]
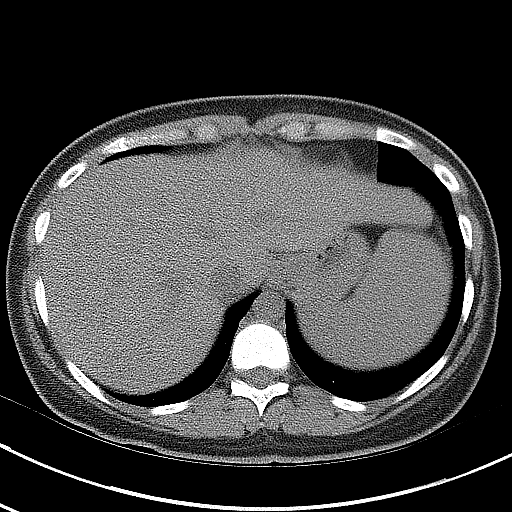
[im 12/20  lung]
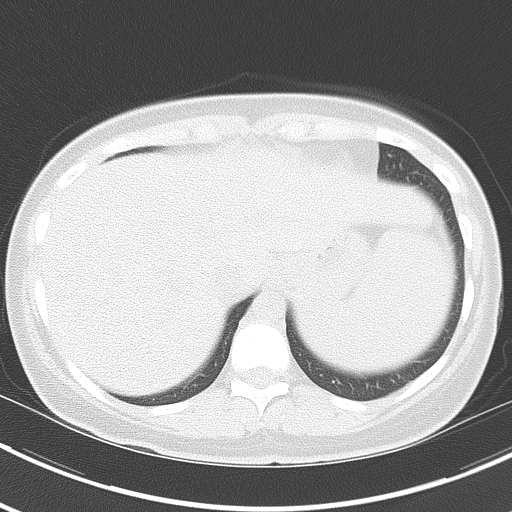
[im 16/20  soft-tissue]
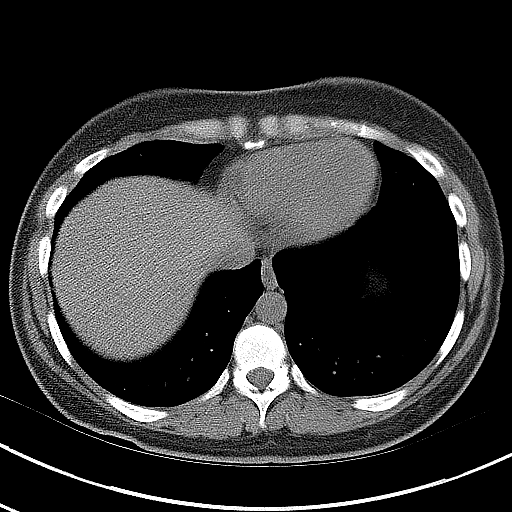
[im 16/20  lung]
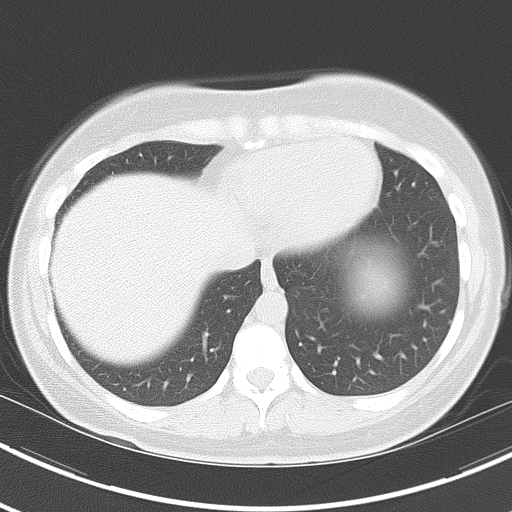

[Series 5: coronal · coronal · 0.64mm/px · 3 of 112 slices shown, 4 images]
[im 38/112  soft-tissue]
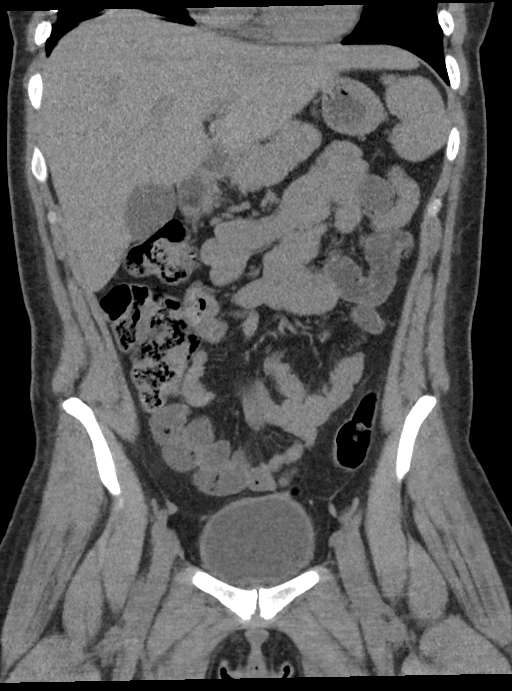
[im 50/112  soft-tissue]
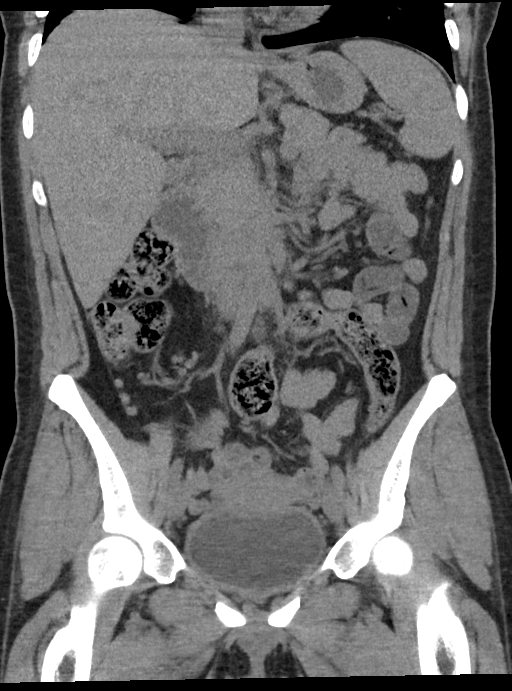
[im 50/112  bone]
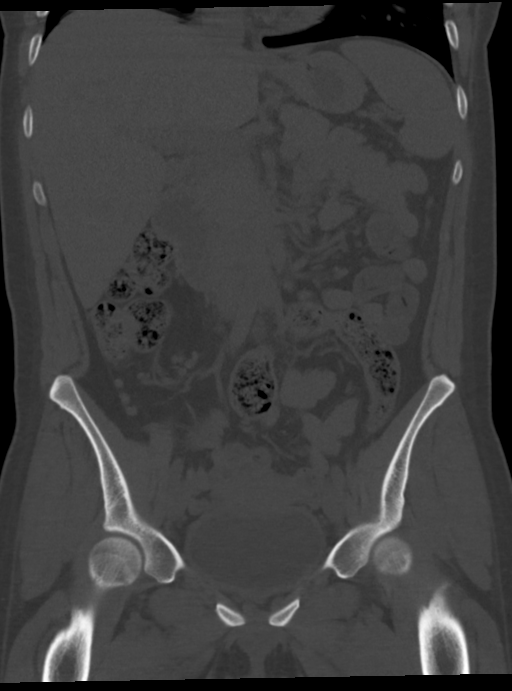
[im 62/112  soft-tissue]
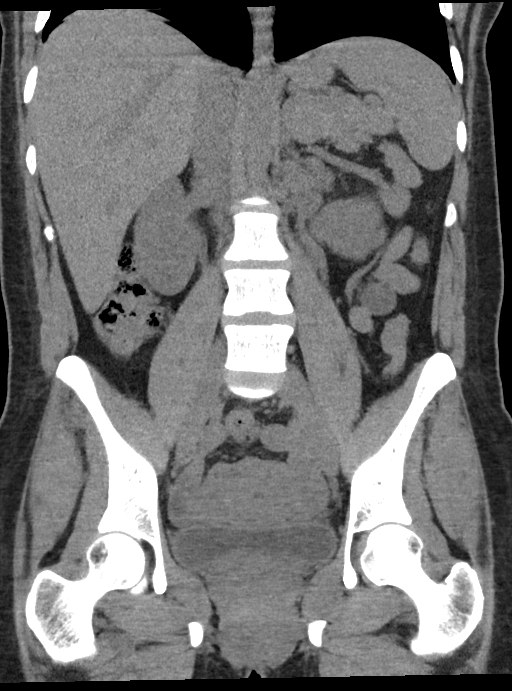

[Series 6: sagittal · sagittal · 0.51mm/px · 1 of 158 slices shown, 2 images]
[im 53/158  soft-tissue]
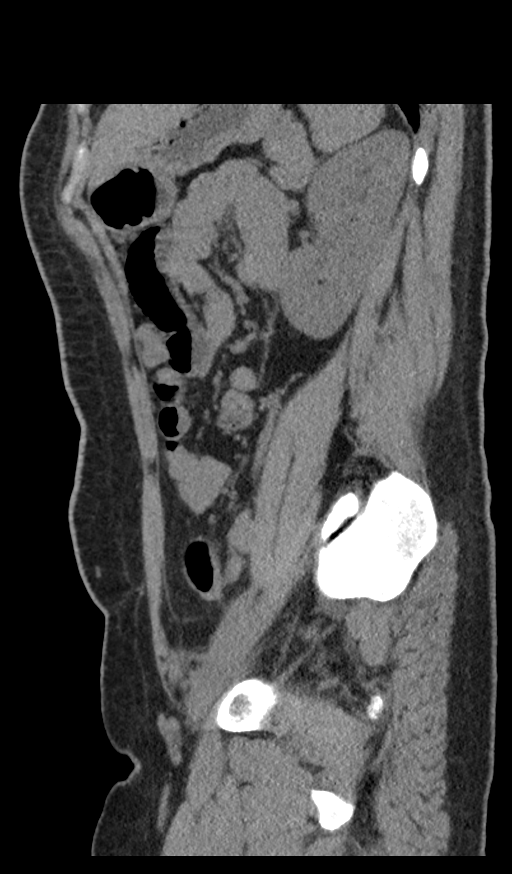
[im 53/158  bone]
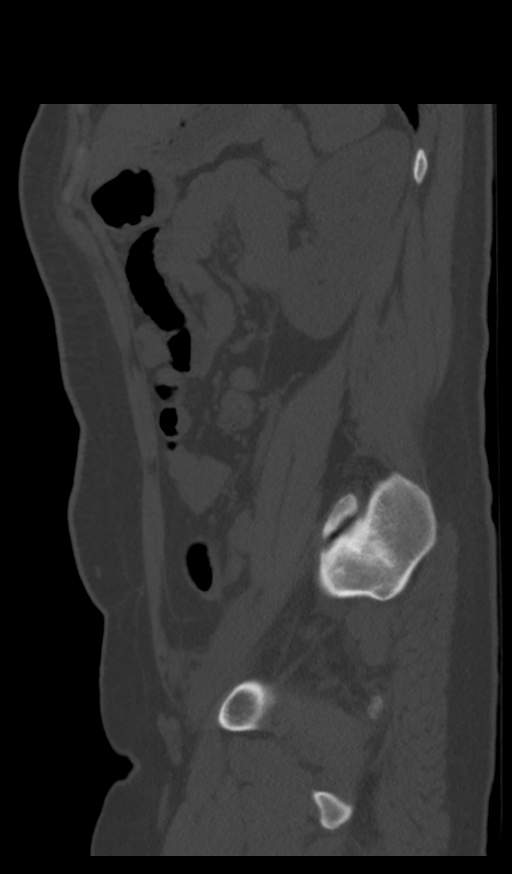

[8 of 46 positions shown; findings below may reference images not displayed]

FINDINGS: Lower chest: No acute abnormality.

Hepatobiliary: No focal liver abnormality is seen. No gallstones,
gallbladder wall thickening, or biliary dilatation.

Pancreas: Unremarkable. No pancreatic ductal dilatation or
surrounding inflammatory changes.

Spleen: Normal in size without focal abnormality.

Adrenals/Urinary Tract: Adrenal glands are unremarkable. Kidneys are
normal, without renal calculi, focal lesion, or hydronephrosis.
Bladder is unremarkable.

Stomach/Bowel: Stomach is within normal limits. Appendix appears
normal. No evidence of bowel wall thickening, distention, or
inflammatory changes.

Vascular/Lymphatic: No significant vascular findings are present. No
enlarged abdominal or pelvic lymph nodes.

Reproductive: Small calcified fibroid at the anterior fundus of the
uterus. No adnexal mass.

Other: No abdominal wall hernia or abnormality. No abdominopelvic
ascites.

Musculoskeletal: No acute or significant osseous findings.
IMPRESSION: 1. Negative for hydronephrosis or ureteral stone
2. Small calcified uterine fibroid
